# Patient Record
Sex: Female | Born: 1980 | Hispanic: Yes | Marital: Single | Smoking: Never smoker
Health system: Southern US, Community
[De-identification: ages and names within clinical notes are randomized; demographics above are authoritative.]

---

## 2021-04-24 ENCOUNTER — Other Ambulatory Visit: Payer: Self-pay

## 2021-04-24 DIAGNOSIS — Z1231 Encounter for screening mammogram for malignant neoplasm of breast: Secondary | ICD-10-CM

## 2021-05-01 ENCOUNTER — Ambulatory Visit: Payer: Self-pay | Attending: Oncology | Admitting: *Deleted

## 2021-05-01 ENCOUNTER — Ambulatory Visit: Payer: Self-pay

## 2021-05-01 ENCOUNTER — Encounter (INDEPENDENT_AMBULATORY_CARE_PROVIDER_SITE_OTHER): Payer: Self-pay

## 2021-05-01 ENCOUNTER — Other Ambulatory Visit: Payer: Self-pay

## 2021-05-01 VITALS — BP 107/71 | HR 68 | Resp 18 | Wt 170.0 lb

## 2021-05-01 DIAGNOSIS — Z1239 Encounter for other screening for malignant neoplasm of breast: Secondary | ICD-10-CM

## 2021-05-01 DIAGNOSIS — N6452 Nipple discharge: Secondary | ICD-10-CM

## 2021-05-01 NOTE — Progress Notes (Signed)
Ms. Kari Cook is a 41 y.o. female who presents to [facility] clinic today with complaint of bilateral spontaneous nipple discharge x one year that is clear.    Pap Smear: Pap smear not completed today. Last Pap smear was in January 2023 at [facility] clinic and was normal per patient. Per patient has history of an abnormal Pap smear 19 years ago that a colposcopy and LEEP were completed for follow up. Per patient all Pap smears have been normal since LEEP and that she has had at least three normals. Last Pap smear result is not available in Epic.   Physical exam: Breasts Breasts symmetrical. No skin abnormalities bilateral breasts. No nipple retraction bilateral breasts. Expressed a milky colored discharge from the right nipple on exam. Sample of discharge sent to Cytology for evaluation. Unable to express any nipple discharge from the left breast on exam. No lymphadenopathy. No lumps palpated bilateral breasts. No complaints of pain or tenderness on exam.       Pelvic/Bimanual Pap is not indicated today per BCCCP guidelines.   Smoking History: Patient has never smoked.    Patient Navigation: Patient education provided. Access to services provided for patient through [employer] program. Spanish interpreter Kristeen Mans from [facility] provided.    Breast and Cervical Cancer Risk Assessment: Patient does not have family history of breast cancer, known genetic mutations, or radiation treatment to the chest before age 61. Per patient has history of cervical dysplasia. Patient has no history of being immunocompromised or DES exposure in-utero.  Risk Assessment     Risk Scores       05/01/2021   Last edited by: Priscille Heidelberg, RN   5-year risk: 0.3 %   Lifetime risk: 4.6 %           A: BCCCP exam without pap smear Complaint of bilateral spontaneous nipple discharge.  P: Referred patient to the [facility] for a diagnostic mammogram. Sent  email to [facility] to call patient and schedule.  Priscille Heidelberg, RN 05/01/2021 3:20 PM

## 2021-05-01 NOTE — Patient Instructions (Signed)
Explained breast self awareness with Kari Cook. Patient did not need a Pap smear today due to last Pap smear was in January 2023 per patient. Let her know BCCCP will cover Pap smears every 3 years unless has a history of abnormal Pap smears. Referred patient to the [facility] for a diagnostic mammogram. Sent email to [facility] to call patient and schedule. Patient aware that she will receive a call to schedule mammogram appointment. Let patient know will follow up with her within the next couple weeks with results of her breast discharge by phone. Kari Cook verbalized understanding.  Derick Seminara, Kathaleen Maser, RN 3:20 PM

## 2021-05-06 LAB — CYTOLOGY - NON PAP

## 2021-05-08 ENCOUNTER — Telehealth: Payer: Self-pay

## 2021-05-08 NOTE — Telephone Encounter (Signed)
Called patient via Kari Cook, Kari Cook to give lab results for breast discharge. Informed patient that breast discharge showed no malignant cells. Reminded patient that she is scheduled for diagnostic mammo appointment @ [place] on 05/16/21 @ 9:40 am. Patient voiced understanding.

## 2021-05-16 ENCOUNTER — Ambulatory Visit
Admission: RE | Admit: 2021-05-16 | Discharge: 2021-05-16 | Disposition: A | Discharge status: Home / Self Care | Payer: Self-pay | Source: Ambulatory Visit | Attending: Obstetrics and Gynecology | Admitting: Obstetrics and Gynecology

## 2021-05-16 ENCOUNTER — Other Ambulatory Visit: Payer: Self-pay | Admitting: Obstetrics and Gynecology

## 2021-05-16 ENCOUNTER — Other Ambulatory Visit: Payer: Self-pay

## 2021-05-16 DIAGNOSIS — R928 Other abnormal and inconclusive findings on diagnostic imaging of breast: Secondary | ICD-10-CM

## 2021-05-16 DIAGNOSIS — N6452 Nipple discharge: Secondary | ICD-10-CM

## 2021-05-19 ENCOUNTER — Other Ambulatory Visit: Payer: Self-pay | Admitting: *Deleted

## 2021-05-19 DIAGNOSIS — R921 Mammographic calcification found on diagnostic imaging of breast: Secondary | ICD-10-CM

## 2021-05-20 ENCOUNTER — Other Ambulatory Visit: Payer: Self-pay | Admitting: Obstetrics and Gynecology

## 2021-05-20 DIAGNOSIS — R921 Mammographic calcification found on diagnostic imaging of breast: Secondary | ICD-10-CM

## 2021-05-31 ENCOUNTER — Other Ambulatory Visit: Payer: Self-pay

## 2021-05-31 ENCOUNTER — Ambulatory Visit
Admission: RE | Admit: 2021-05-31 | Discharge: 2021-05-31 | Disposition: A | Discharge status: Home / Self Care | Payer: Self-pay | Source: Ambulatory Visit | Attending: Obstetrics and Gynecology | Admitting: Obstetrics and Gynecology

## 2021-05-31 DIAGNOSIS — R921 Mammographic calcification found on diagnostic imaging of breast: Secondary | ICD-10-CM

## 2021-05-31 HISTORY — PX: BREAST BIOPSY: SHX20

## 2021-06-03 LAB — SURGICAL PATHOLOGY

## 2021-06-11 ENCOUNTER — Other Ambulatory Visit: Payer: Self-pay

## 2021-06-11 ENCOUNTER — Other Ambulatory Visit: Payer: Self-pay | Admitting: Obstetrics and Gynecology

## 2021-06-11 DIAGNOSIS — N6452 Nipple discharge: Secondary | ICD-10-CM

## 2021-06-12 ENCOUNTER — Ambulatory Visit: Admission: RE | Admit: 2021-06-12 | Payer: Self-pay | Source: Ambulatory Visit

## 2021-08-06 ENCOUNTER — Ambulatory Visit
Admission: RE | Admit: 2021-08-06 | Discharge: 2021-08-06 | Disposition: A | Discharge status: Home / Self Care | Payer: Self-pay | Source: Ambulatory Visit | Attending: Obstetrics and Gynecology | Admitting: Obstetrics and Gynecology

## 2021-08-06 DIAGNOSIS — N6452 Nipple discharge: Secondary | ICD-10-CM

## 2021-08-06 MED ORDER — GADOBUTROL 1 MMOL/ML IV SOLN
7.0000 mL | Freq: Once | INTRAVENOUS | Status: AC | PRN
  Administered 2021-08-06: 7 mL via INTRAVENOUS

## 2021-08-13 ENCOUNTER — Telehealth: Payer: Self-pay

## 2021-08-13 NOTE — Telephone Encounter (Signed)
Via Annice Pih, Spanish Interpreter [facility]), Patient informed MRI results, no malignancy seen in either breast, no reason seen for nipple discharge, recommendation is for surgical consult. Patient also informed that if further treatment is needed, BCCCP will offer BCCCP Medicaid, but must be a [place] citizen or a resident. If not, we can send application for [facility] Financial assistance. Patient stated she was not a citizen or resident, verbalized understanding. Joellyn Quails ([facility]-BCCCP Personal assistant) informed patient surgical appointment is Aug 16, 2021 @ 10:45 am with [facility].  ?

## 2021-08-19 ENCOUNTER — Ambulatory Visit (INDEPENDENT_AMBULATORY_CARE_PROVIDER_SITE_OTHER): Payer: Self-pay | Admitting: Surgery

## 2021-08-19 ENCOUNTER — Other Ambulatory Visit: Payer: Self-pay

## 2021-08-19 ENCOUNTER — Encounter: Payer: Self-pay | Admitting: Surgery

## 2021-08-19 VITALS — BP 118/77 | HR 80 | Temp 98.1°F | Ht 59.0 in | Wt 175.2 lb

## 2021-08-19 DIAGNOSIS — N6452 Nipple discharge: Secondary | ICD-10-CM

## 2021-08-19 NOTE — Patient Instructions (Addendum)
We will contact you November 2023 to schedule a follow up visit. If you do not hear from our office please call us.  ?

## 2021-08-20 NOTE — Progress Notes (Signed)
Patient ID: Kari Cook, female   DOB: 01/03/1981, 41 y.o.   MRN: [id number] ? ?HPI ?Kari Cook is a 41 y.o. female seen in consultation for right breast discharge.  She reports she has intermittent spontaneous nipple drainage from the right side mostly.  It is clear.  He denies any breast pain. ?He has had mammogram and ultrasound as well as an MRI.  I personally reviewed the images.  There was evidence of Dmitriy on the right breast retroareolar area.  This underwent biopsyBENIGN MAMMARY PARENCHYMA WITH ?FIBROCYSTIC, APOCRINE, AND COLUMNAR CELL CHANGES, WITH ASSOCIATED, there were some benign calcifications as well.Marland Kitchen ?MRI did not show any significant pathological lesions. ?He feels better. ? ?HPI ? ?History reviewed. No pertinent past medical history. ? ?Past Surgical History:  ?Procedure Laterality Date  ? BREAST BIOPSY Right 05/31/2021  ? stereo bx calcs, "X" clip-path pending  ? ? ?Family History  ?Problem Relation Age of Onset  ? Diabetes Mother   ? COPD Father   ? ? ?Social History ?Social History  ? ?Tobacco Use  ? Smoking status: Never  ? Smokeless tobacco: Never  ?Substance Use Topics  ? Alcohol use: Never  ? Drug use: Never  ? ? ?No Known Allergies ? ?Current Outpatient Medications  ?Medication Sig Dispense Refill  ? fluticasone (FLONASE) 50 MCG/ACT nasal spray Place 1 spray into both nostrils daily.    ? IBU 600 MG tablet Take 600 mg by mouth every 8 (eight) hours as needed.    ? ?No current facility-administered medications for this visit.  ? ? ? ?Review of Systems ?Full ROS  was asked and was negative except for the information on the HPI ? ?Physical Exam ?Blood pressure 118/77, pulse 80, temperature 98.1 ?F (36.7 ?C), temperature source Oral, height 4\' 11"  (1.499 m), weight 175 lb 3.2 oz (79.5 kg), SpO2 95 %. ?CONSTITUTIONAL: NAD. ?EYES: Pupils are equal, round, and reactive to light, Sclera are non-icteric. ?EARS, NOSE, MOUTH AND THROAT: The oropharynx is clear. The oral  mucosa is pink and moist. Hearing is intact to voice. ?LYMPH NODES:  Lymph nodes in the neck are normal. ?RESPIRATORY:  Lungs are clear. There is normal respiratory effort, with equal breath sounds bilaterally, and without pathologic use of accessory muscles. ?CARDIOVASCULAR: Heart is regular without murmurs, gallops, or rubs. ?BREAST: The nipples bilaterally are normal.  There are no palpable masses on either breast.  Bilateral axillas are free of disease.  No evidence of nipple discharge on either breast ?GI: The abdomen is  soft, nontender, and nondistended. There are no palpable masses. There is no hepatosplenomegaly. There are normal bowel sounds in all quadrants. ?GU: Rectal deferred.   ?MUSCULOSKELETAL: Normal muscle strength and tone. No cyanosis or edema.   ?SKIN: Turgor is good and there are no pathologic skin lesions or ulcers. ?NEUROLOGIC: Motor and sensation is grossly normal. Cranial nerves are grossly intact. ?PSYCH:  Oriented to person, place and time. Affect is normal. ? ?Data Reviewed ? ?I have personally reviewed the patient's imaging, laboratory findings and medical records.   ? ?Assessment/Plan ?41 year old female with history of nipple discharge right breast. ?At this time the symptoms and discharge seem to be sniffily improved.  Discussed with the patient in detail about her disease process at this time no need for surgical intervention.  I will follow her up in about 6 months.  If the discharge persists may need to perform ventral duct excision.  No evidence of malignancy.  Extensive counseling provided. ?  I spent 45 minutes in this encounter including personally reviewing imaging studies, counseling the patient, placing orders and performing appropriate documentation ? ? ?Sterling Big, MD FACS ?General Surgeon ?08/20/2021, 10:59 AM ? ?  ?

## 2022-02-19 ENCOUNTER — Ambulatory Visit: Payer: Self-pay | Admitting: Surgery

## 2022-03-17 ENCOUNTER — Encounter: Payer: Self-pay | Admitting: Surgery

## 2022-03-17 ENCOUNTER — Ambulatory Visit (INDEPENDENT_AMBULATORY_CARE_PROVIDER_SITE_OTHER): Payer: Self-pay | Admitting: Surgery

## 2022-03-17 VITALS — BP 108/72 | HR 74 | Temp 98.4°F | Wt 171.2 lb

## 2022-03-17 DIAGNOSIS — M533 Sacrococcygeal disorders, not elsewhere classified: Secondary | ICD-10-CM

## 2022-03-17 DIAGNOSIS — M25559 Pain in unspecified hip: Secondary | ICD-10-CM

## 2022-03-17 NOTE — Patient Instructions (Addendum)
Your CT is scheduled for 03/26/2022 at 11 am (arrive by 10:30 am) @ [facility]. Nothing to eat or drink 4 hours prior.     If you have any concerns or questions, please feel free to call our office.   Autoexamen de [employer] Breast Self-Awareness El autoexamen de mamas es para [employer] la apariencia y la sensibilidad de las [place]. Es necesario que respete estas indicaciones: Controle sus Counsellor. Informe al mdico si hay algn cambio. Familiarcese con el aspecto y con la forma en que se sienten sus mamas al palparlas. Esto puede ayudarle a Nurse, children's en las mamas mientras todava es pequeo y puede tratarse. Debe hacerse un autoexamen de mamas incluso si tiene implantes mamarios. Lo que necesita: Un espejo. Una habitacin bien iluminada. Una almohada u otro objeto blando. Cmo realizar el autoexamen de mamas Siga estos pasos para Education officer, environmental un autoexamen de mamas: Busque cambios  Qutese toda la ropa por encima de la cintura. Prese frente a un espejo en una habitacin con buena iluminacin. Coloque las manos a los costados. Compare las mamas en el espejo. Busque diferencias entre ellas, por ejemplo: Una diferencia en la forma. Una diferencia en el tamao. Arrugas, depresiones y protuberancias en [employer] y no en la otra.

## 2022-03-21 NOTE — Progress Notes (Addendum)
Outpatient Surgical Follow Up  03/21/2022  Kari Cook is an 41 y.o. female.   Chief Complaint  Patient presents with   Follow-up    6 month f/u bilateral breast discharge    HPI: 41 year old female seen in follow-up for right breast discharge.  She says that the discharge has subsided.  She denies any breast pain.  No breast masses.  She did have a mammogram as well as biopsy for calcifications showing evidence of benign pathology.  Furthermore she did complete an MRI of the bilateral breast showing no evidence of any additional pathology within the breast.  Please note that I have personally reviewed the images.  She now comes with coccyx pain.  She reports that the pain is near the rectal area.  No discharge no drainage no fevers no chills no hematochezia.  History reviewed. No pertinent past medical history.  Past Surgical History:  Procedure Laterality Date   BREAST BIOPSY Right 05/31/2021   stereo bx calcs, "X" clip-path pending    Family History  Problem Relation Age of Onset   Diabetes Mother    COPD Father     Social History:  reports that she has never smoked. She has never used smokeless tobacco. She reports that she does not drink alcohol and does not use drugs.  Allergies: No Known Allergies  Medications reviewed.    ROS Full ROS performed and is otherwise negative other than what is stated in HPI   BP 108/72   Pulse 74   Temp 98.4 F (36.9 C) (Oral)   Wt 171 lb 3.2 oz (77.7 kg)   SpO2 98%   BMI 34.58 kg/m   Physical Exam Vitals and nursing note reviewed. Exam conducted with a chaperone present.  Constitutional:      General: She is not in acute distress.    Appearance: Normal appearance. She is not ill-appearing.  Neck:     Vascular: No carotid bruit.  Cardiovascular:     Rate and Rhythm: Normal rate and regular rhythm.     Heart sounds: No murmur heard.    No friction rub.  Pulmonary:     Effort: Pulmonary effort is normal. No  respiratory distress.     Breath sounds: Normal breath sounds. No stridor.     Comments: BREAST; No evidence of palpable lesion on either breasts or axilla. No LAD, no nipple or skin changes, I was not able to witness any DC, nmo infection Abdominal:     General: Abdomen is flat. There is no distension.     Palpations: Abdomen is soft. There is no mass.     Tenderness: There is no abdominal tenderness. There is no guarding or rebound.     Hernia: No hernia is present.  Genitourinary:    Rectum: Normal.     Comments: NO obvious rectal lesion on rectal exam, no blood, pain in coccyx w/o abscess Musculoskeletal:        General: No swelling or tenderness. Normal range of motion.     Cervical back: Normal range of motion and neck supple. No rigidity or tenderness.  Lymphadenopathy:     Cervical: No cervical adenopathy.  Skin:    General: Skin is warm and dry.     Capillary Refill: Capillary refill takes less than 2 seconds.  Neurological:     General: No focal deficit present.     Mental Status: She is alert and oriented to person, place, and time.  Psychiatric:  Mood and Affect: Mood normal.        Behavior: Behavior normal.        Thought Content: Thought content normal.        Judgment: Judgment normal.    Assessment/Plan: 41 year old female with resolved right nipple discharge likely physiologic.  She did have mammographic evidence of calcifications that were nonpathological.  Now she comes for follow-up and on exam there are no concerning lesions on mammogram.  Another issue that she has is coccyx pain.  On physical exam I did not see evidence of an active infection or obvious anatomical lesions.  We will investigate this further with a CT of the pelvis to make sure were not missing any potential deeper pathology.  I had an extensive discussion with the patient about my thought process.  We will follow her up in a few weeks.  Please note that I spent 40 minutes in this encounter  including personally reviewing imaging studies, coordinating her care, placing orders and performing appropriate documentation.    Sterling Big, MD [facility] General Surgeon

## 2022-03-26 ENCOUNTER — Ambulatory Visit: Admission: RE | Admit: 2022-03-26 | Payer: Self-pay | Source: Ambulatory Visit

## 2023-04-09 ENCOUNTER — Other Ambulatory Visit: Payer: Self-pay

## 2023-04-09 DIAGNOSIS — R921 Mammographic calcification found on diagnostic imaging of breast: Secondary | ICD-10-CM

## 2023-04-09 DIAGNOSIS — N6452 Nipple discharge: Secondary | ICD-10-CM

## 2023-05-18 ENCOUNTER — Ambulatory Visit
Admission: RE | Admit: 2023-05-18 | Discharge: 2023-05-18 | Disposition: A | Discharge status: Home / Self Care | Payer: Self-pay | Source: Ambulatory Visit | Attending: Obstetrics and Gynecology | Admitting: Obstetrics and Gynecology

## 2023-05-18 ENCOUNTER — Ambulatory Visit: Payer: Self-pay | Attending: Hematology and Oncology | Admitting: *Deleted

## 2023-05-18 VITALS — BP 118/78 | Wt 180.1 lb

## 2023-05-18 DIAGNOSIS — N6452 Nipple discharge: Secondary | ICD-10-CM | POA: Insufficient documentation

## 2023-05-18 DIAGNOSIS — R921 Mammographic calcification found on diagnostic imaging of breast: Secondary | ICD-10-CM | POA: Insufficient documentation

## 2023-05-18 DIAGNOSIS — Z1239 Encounter for other screening for malignant neoplasm of breast: Secondary | ICD-10-CM

## 2023-05-18 NOTE — Patient Instructions (Signed)
 Explained breast self awareness with Kari Cook. Patient did not need a Pap smear today due to last Pap smear was 02/14/2021. Patient stated per her PCP she is due for a Pap smear this year. Referred patient to the [facility] for a diagnostic mammogram per recommendation. Appointment scheduled Monday, May 18, 2023 at 1340. Patient aware of appointment and will be there. [name] verbalized understanding.  Daneil Beem, Dela Favor, RN 12:57 PM

## 2023-05-18 NOTE — Progress Notes (Signed)
 Ms. Kari Cook is a 42 y.o. female who presents to [facility] clinic today with no complaints. Patient had a diagnostic mammogram completed 05/31/2021 that a right breast diagnostic mammogram is recommended in 6 months for follow up.   Pap Smear: Pap smear not completed today. Last Pap smear was 02/14/2021 at [facility] clinic and was normal with negative HPV. 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 Per patient has had at least three normal Pap smears since LEEP. Last Pap smear result is available in Epic.   Physical exam: Breasts Right breast larger than left breast that per patient is normal for her. No skin abnormalities bilateral breasts. No nipple retraction bilateral breasts. No nipple discharge bilateral breasts. No lymphadenopathy. No lumps palpated bilateral breasts. No complaints of pain or tenderness on exam.      MM RT BREAST BX W LOC DEV 1ST LESION IMAGE BX SPEC STEREO GUIDE Addendum Date: 06/04/2021 ADDENDUM REPORT: 06/04/2021 15:52 ADDENDUM: PATHOLOGY revealed: A. BREAST, RIGHT UPPER INNER QUADRANT; STEREOTACTIC CORE NEEDLE BIOPSY: - BENIGN MAMMARY PARENCHYMA WITH FIBROCYSTIC, APOCRINE, AND COLUMNAR CELL CHANGES, WITH ASSOCIATED CALCIFICATIONS. - NEGATIVE FOR ATYPICAL PROLIFERATIVE BREAST DISEASE. Pathology results are CONCORDANT with imaging findings, per Dr. Marvene Slipper. Pathology results and recommendations were discussed with patient via [facility] Interpreter [phone] by telephone on 06/03/2021. Patient reported biopsy site doing well with no adverse symptoms, and only slight tenderness at the site. Post biopsy care instructions were reviewed, questions were answered and my direct phone number was provided. Patient was instructed to call [facility] for any additional questions or concerns related to biopsy site.  RECOMMENDATIONS: 1. Recommend bilateral breast MRI for persistent bilateral nipple discharge. Recommend attention on MRI to the medial aspect of the right breast. If the breast MRI is performed and negative in the medial aspect of the right breast, recommend no further workup of this area. 2. If breast MRI is not performed, recommend diagnostic mammogram of the right breast in 6 months (August 2023). Pathology results reported by Ladonna Pickup RN on 06/03/2021. Electronically Signed   By: Allena Ito M.D.   On: 06/04/2021 15:52   Result Date: 06/04/2021 CLINICAL DATA:  43 year old with a mammographically detected indeterminate 7 mm group of calcifications in the UPPER INNER QUADRANT of the RIGHT breast at anterior depth. She presented with spontaneous clear nipple discharge bilaterally. EXAM: RIGHT BREAST STEREOTACTIC CORE NEEDLE BIOPSY COMPARISON:  Previous exams. FINDINGS: The patient and I discussed the procedure of stereotactic-guided biopsy including benefits and alternatives. We discussed the high likelihood of a successful procedure. We discussed the risks of the procedure including infection, bleeding, tissue injury, clip migration, and inadequate sampling. Communication with the patient was achieved with the assistance of a certified interpreter. Informed written consent was given. The usual time out protocol was performed immediately prior to the procedure. Lesion quadrant: UPPER INNER QUADRANT. Using sterile technique with chlorhexidine as skin antisepsis, 1% lidocaine and 1% lidocaine with epinephrine as local anesthetic, under stereotactic tomosynthesis guidance, a 9 gauge Brevera vacuum assisted device was used to perform core needle biopsy of calcifications in the upper inner RIGHT breast using a superior approach. Specimen radiograph was performed showing calcifications in at least 2 if not 3 of the core samples. Specimens with calcifications are identified for pathology. At the conclusion of the  procedure, an X  shaped tissue marker clip was deployed into the biopsy cavity. Follow-up 2D and 3D full field CC and mediolateral mammographic images were obtained to confirm clip placement. The X shaped tissue marker clip migrated approximately 1 cm inferior to the biopsied calcifications due to the accordion effect. There are a few residual calcifications at the biopsy site. Expected post biopsy changes are present without evidence of hematoma. IMPRESSION:.: IMPRESSION:. 1. Stereotactic tomosynthesis core needle biopsy of a group of calcifications in the UPPER INNER QUADRANT of the RIGHT breast at anterior depth. No apparent complications. 2. Approximate 1 cm inferior migration of the X shaped tissue marking clip relative to the biopsied calcifications. There are residual calcifications at the biopsy site. Electronically Signed: By: Rinda Cheers M.D. On: 05/31/2021 09:28  MS CLIP PLACEMENT RIGHT Addendum Date: 06/04/2021 ADDENDUM REPORT: 06/04/2021 15:52 ADDENDUM: PATHOLOGY revealed: A. BREAST, RIGHT UPPER INNER QUADRANT; STEREOTACTIC CORE NEEDLE BIOPSY: - BENIGN MAMMARY PARENCHYMA WITH FIBROCYSTIC, APOCRINE, AND COLUMNAR CELL CHANGES, WITH ASSOCIATED CALCIFICATIONS. - NEGATIVE FOR ATYPICAL PROLIFERATIVE BREAST DISEASE. Pathology results are CONCORDANT with imaging findings, per Dr. Marvene Slipper. Pathology results and recommendations were discussed with patient via [facility] Interpreter [phone] by telephone on 06/03/2021. Patient reported biopsy site doing well with no adverse symptoms, and only slight tenderness at the site. Post biopsy care instructions were reviewed, questions were answered and my direct phone number was provided. Patient was instructed to call [facility] for any additional questions or concerns related to biopsy site. RECOMMENDATIONS: 1. Recommend bilateral breast MRI for persistent bilateral nipple discharge. Recommend attention on MRI to the medial aspect of the right  breast. If the breast MRI is performed and negative in the medial aspect of the right breast, recommend no further workup of this area. 2. If breast MRI is not performed, recommend diagnostic mammogram of the right breast in 6 months (August 2023). Pathology results reported by Ladonna Pickup RN on 06/03/2021. Electronically Signed   By: Allena Ito M.D.   On: 06/04/2021 15:52   Result Date: 06/04/2021 CLINICAL DATA:  43 year old with a mammographically detected indeterminate 7 mm group of calcifications in the UPPER INNER QUADRANT of the RIGHT breast at anterior depth. She presented with spontaneous clear nipple discharge bilaterally. EXAM: RIGHT BREAST STEREOTACTIC CORE NEEDLE BIOPSY COMPARISON:  Previous exams. FINDINGS: The patient and I discussed the procedure of stereotactic-guided biopsy including benefits and alternatives. We discussed the high likelihood of a successful procedure. We discussed the risks of the procedure including infection, bleeding, tissue injury, clip migration, and inadequate sampling. Communication with the patient was achieved with the assistance of a certified interpreter. Informed written consent was given. The usual time out protocol was performed immediately prior to the procedure. Lesion quadrant: UPPER INNER QUADRANT. Using sterile technique with chlorhexidine as skin antisepsis, 1% lidocaine and 1% lidocaine with epinephrine as local anesthetic, under stereotactic tomosynthesis guidance, a 9 gauge Brevera vacuum assisted device was used to perform core needle biopsy of calcifications in the upper inner RIGHT breast using a superior approach. Specimen radiograph was performed showing calcifications in at least 2 if not 3 of the core samples. Specimens with calcifications are identified for pathology. At the conclusion of the procedure, an X shaped tissue marker clip was deployed into the biopsy cavity. Follow-up 2D and 3D full field CC and mediolateral mammographic images were  obtained to confirm clip placement. The X shaped tissue marker clip migrated approximately 1 cm inferior to the biopsied calcifications due to the accordion effect. There are  a few residual calcifications at the biopsy site. Expected post biopsy changes are present without evidence of hematoma. IMPRESSION:.: IMPRESSION:. 1. Stereotactic tomosynthesis core needle biopsy of a group of calcifications in the UPPER INNER QUADRANT of the RIGHT breast at anterior depth. No apparent complications. 2. Approximate 1 cm inferior migration of the X shaped tissue marking clip relative to the biopsied calcifications. There are residual calcifications at the biopsy site. Electronically Signed: By: Rinda Cheers M.D. On: 05/31/2021 09:28   MS DIGITAL DIAG TOMO BILAT Result Date: 05/16/2021 CLINICAL DATA:  43 year old female presenting with intermittent spontaneous clear nipple discharge bilaterally. Note from the provider reports brownish discharge. EXAM: DIGITAL DIAGNOSTIC BILATERAL MAMMOGRAM WITH TOMOSYNTHESIS AND CAD; ULTRASOUND LEFT BREAST LIMITED; ULTRASOUND RIGHT BREAST LIMITED TECHNIQUE: Bilateral digital diagnostic mammography and breast tomosynthesis was performed. The images were evaluated with computer-aided detection.; Targeted ultrasound examination of the left breast was performed.; Targeted ultrasound examination of the right breast was performed COMPARISON:  None. ACR Breast Density Category b: There are scattered areas of fibroglandular density. FINDINGS: Mammogram: Right breast: Spot 2D magnification views as well as spot compression tomosynthesis views were performed in addition to standard views. There are grouped punctate calcifications in the retroareolar superior right breast best seen on lateral view spanning approximately 0.7 cm. There is a regional asymmetry in the medial aspect of the right breast which is less conspicuous on the spot imaging and may represent normal tissue. An additional questioned  asymmetry in the outer aspect of the right breast resolves on the additional imaging. Left breast: Spot 2D magnification views of the retroareolar left breast were performed in addition to standard views. There is no abnormality in the retroareolar aspect or elsewhere in the left breast just the presence of malignancy. On physical exam there are no skin changes or rash. The patient is unable to express any nipple discharge from the right nipple. Ultrasound: Right breast: Targeted ultrasound performed throughout the retroareolar aspect of the right breast demonstrating a small oval circumscribed anechoic mass at 12 o'clock retroareolar measuring 0.6 x 0.3 x 0.3 cm, possibly a focally ectatic duct or cyst. Targeted ultrasound performed throughout the medial aspect of the right breast demonstrates no cystic or solid mass. Left breast: Targeted ultrasound performed throughout the retroareolar aspect of the left breast demonstrates no cystic or solid mass. No dilated duct or fluid collection. IMPRESSION: 1. Indeterminate calcifications spanning 0.7 cm in the superior retroareolar right breast. 2. Probably benign asymmetry in the medial aspect of the right breast without sonographic correlate. 3. No mammographic or sonographic evidence of malignancy in the left breast. RECOMMENDATION: 1. Stereotactic core needle biopsy of the right breast calcifications. 2. Following biopsy recommend breast MRI for the bilateral nipple discharge. Recommend attention on MRI to the medial aspect of the right breast. 3. If the breast MRI is performed and negative in the medial aspect of the right breast recommend no further workup of this area. If the MRI is not performed recommend diagnostic mammogram of the right breast in 6 months. I have discussed the findings and recommendations with the patient. She will be contacted by our scheduler to arrange the biopsy appointment. BI-RADS CATEGORY  4: Suspicious. Electronically Signed   By: Allena Ito M.D.   On: 05/16/2021 11:44   Pelvic/Bimanual Pap is not indicated today per BCCCP guidelines.   Smoking History: Patient has never smoked.   Patient Navigation: Patient education provided. Access to services provided for patient through [facility] program. Spanish interpreter [place]  Kari Cook from [facility] provided.    Breast and Cervical Cancer Risk Assessment: Patient does not have family history of breast cancer, known genetic mutations, or radiation treatment to the chest before age 70. Per patient has history of cervical dysplasia. Patient has no history of being immunocompromised or DES exposure in-utero.  Risk Scores as of Encounter on 05/18/2023     Kari Cook           5-year 1.08%   Lifetime 10.52%   This patient is [place] but has no documented birth country, so the [place] model used data from [place] patients to calculate their risk score. Document a birth country in the Demographics activity for a more accurate score.         Last calculated by Kari Ingle, LPN on 09/09/5407 at 12:51 PM        A: BCCCP exam without pap smear No complaints.  P: Referred patient to the [facility] for a diagnostic mammogram per recommendation. Appointment scheduled Monday, May 18, 2023 at 1340.  Kari Edge, RN 05/18/2023 12:57 PM

## 2023-05-25 ENCOUNTER — Ambulatory Visit: Payer: Self-pay | Admitting: Surgery

## 2023-12-08 IMAGING — US US BREAST*L* LIMITED INC AXILLA
1 series · 4 of 4 positions shown · non-contrast
Comparison: None.

CLINICAL DATA: 40-year-old female presenting with intermittent
spontaneous clear nipple discharge bilaterally. Note from the
provider reports brownish discharge.



[Series 1: us breast*left* limited inc axilla · 0.07mm/px · 4 of 4 slices shown]
[im 1/4]
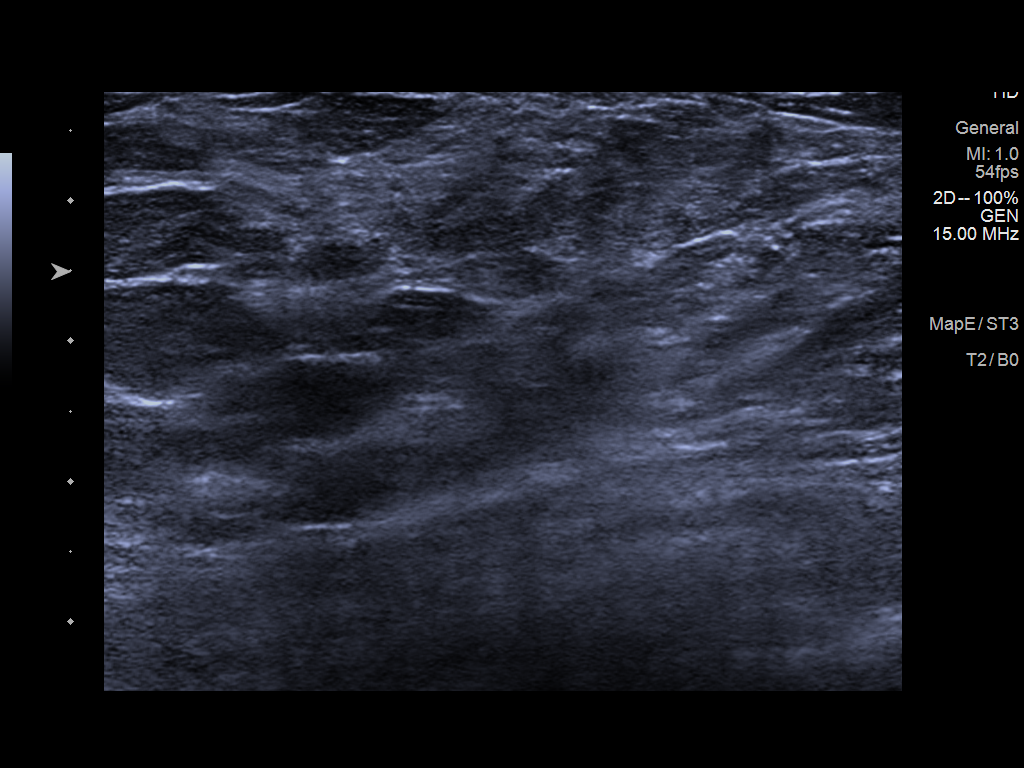
[im 2/4]
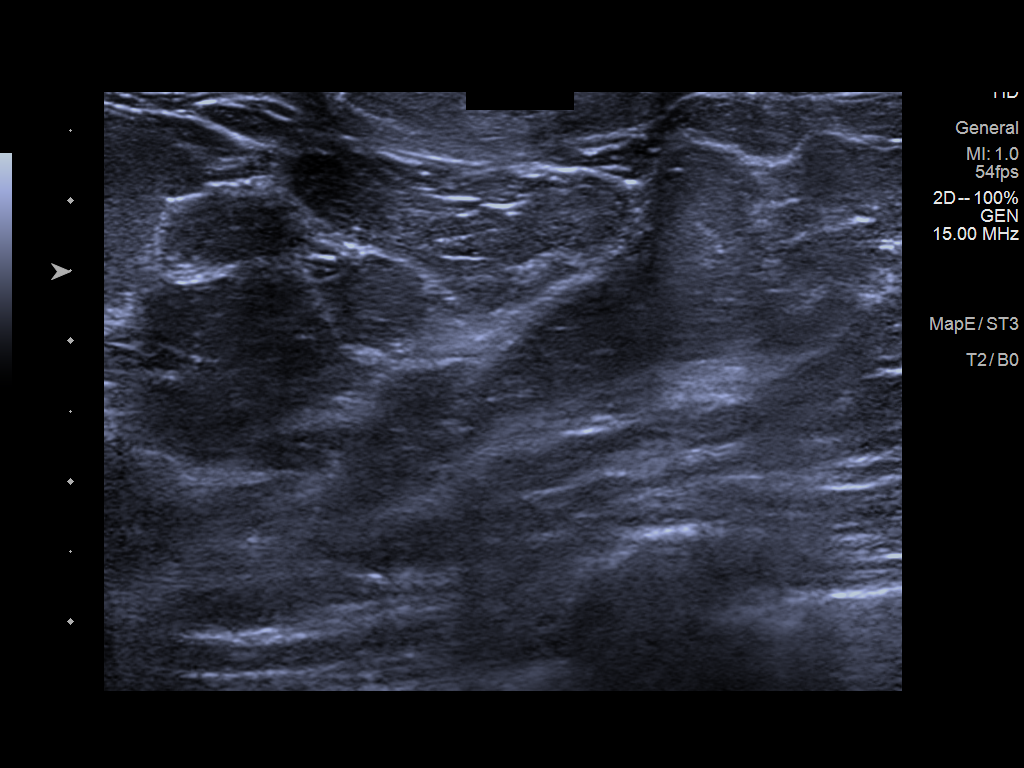
[im 3/4]
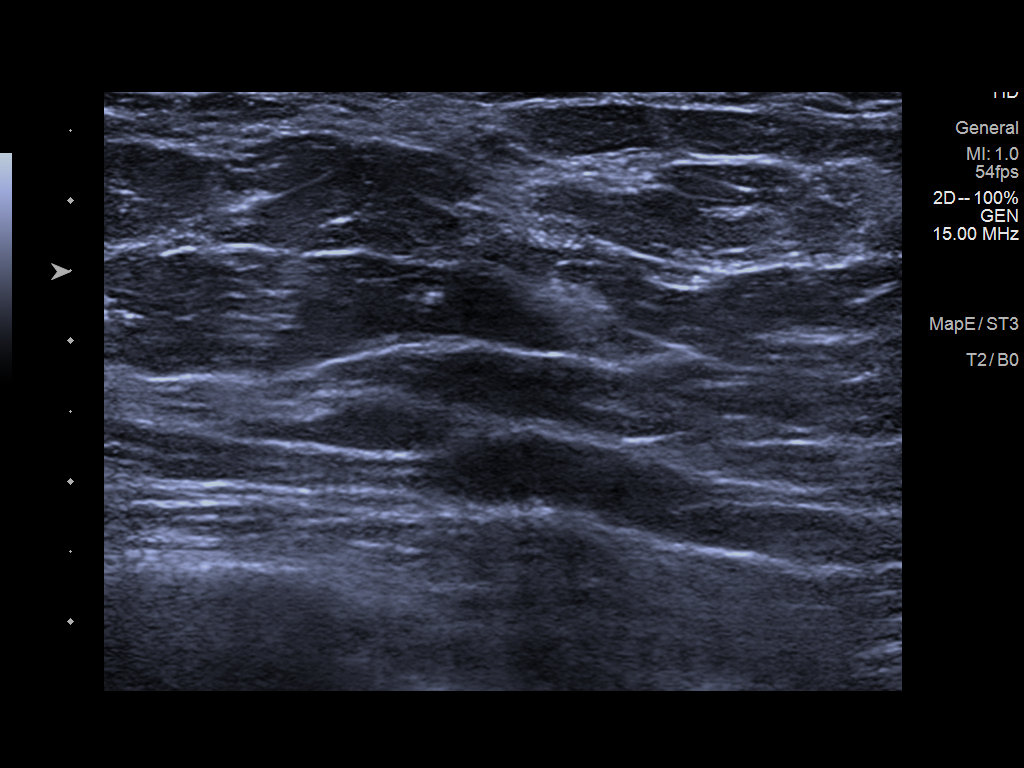
[im 4/4]
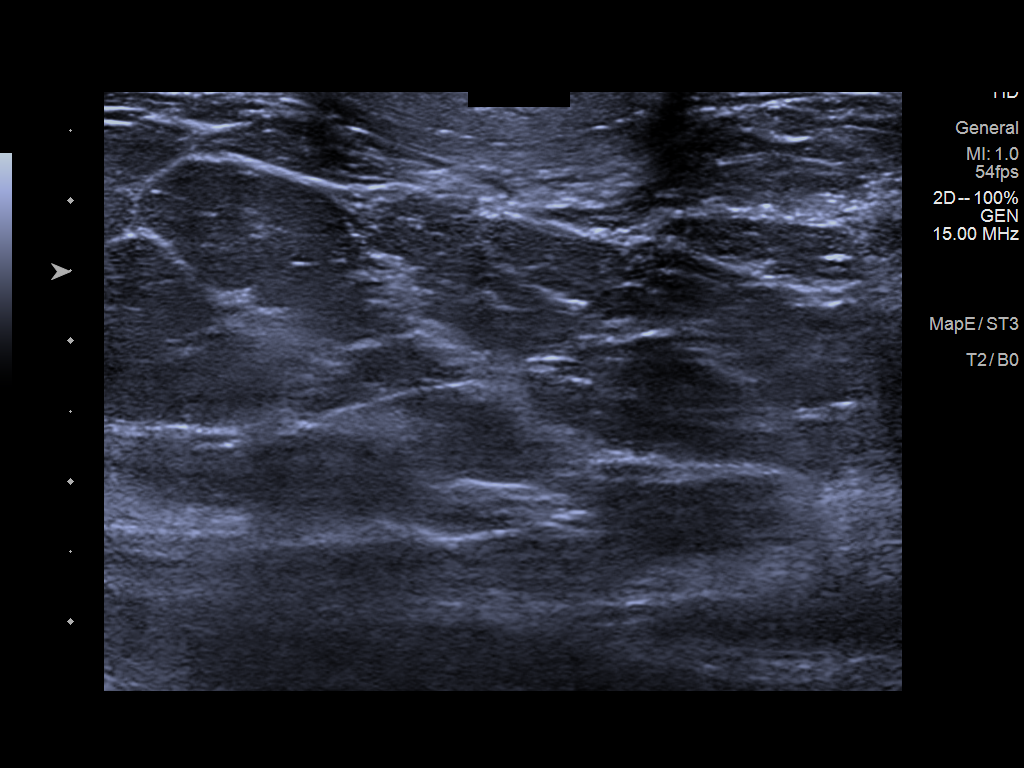

[4 of 4 positions shown; findings below may reference images not displayed]

ACR Breast Density Category b: There are scattered areas of
fibroglandular density.
FINDINGS: Mammogram:

Right breast: Spot 2D magnification views as well as spot
compression tomosynthesis views were performed in addition to
standard views. There are grouped punctate calcifications in the
retroareolar superior right breast best seen on lateral view
spanning approximately 0.7 cm. There is a regional asymmetry in the
medial aspect of the right breast which is less conspicuous on the
spot imaging and may represent normal tissue. An additional
questioned asymmetry in the outer aspect of the right breast
resolves on the additional imaging.

Left breast: Spot 2D magnification views of the retroareolar left
breast were performed in addition to standard views. There is no
abnormality in the retroareolar aspect or elsewhere in the left
breast just the presence of malignancy.

On physical exam there are no skin changes or rash. The patient is
unable to express any nipple discharge from the right nipple.

Ultrasound:

Right breast: Targeted ultrasound performed throughout the
retroareolar aspect of the right breast demonstrating a small oval
circumscribed anechoic mass at 12 o'clock retroareolar measuring
x 0.3 x 0.3 cm, possibly a focally ectatic duct or cyst. Targeted
ultrasound performed throughout the medial aspect of the right
breast demonstrates no cystic or solid mass.

Left breast: Targeted ultrasound performed throughout the
retroareolar aspect of the left breast demonstrates no cystic or
solid mass. No dilated duct or fluid collection.
IMPRESSION: 1. Indeterminate calcifications spanning 0.7 cm in the superior
retroareolar right breast.

2. Probably benign asymmetry in the medial aspect of the right
breast without sonographic correlate.

3. No mammographic or sonographic evidence of malignancy in the left
breast.

RECOMMENDATION:
1. Stereotactic core needle biopsy of the right breast
calcifications.

2. Following biopsy recommend breast MRI for the bilateral nipple
discharge. Recommend attention on MRI to the medial aspect of the
right breast.

3. If the breast MRI is performed and negative in the medial aspect
of the right breast recommend no further workup of this area. If the
MRI is not performed recommend diagnostic mammogram of the right
breast in 6 months.

I have discussed the findings and recommendations with the patient.
She will be contacted by our scheduler to arrange the biopsy
appointment.

BI-RADS CATEGORY  4: Suspicious.

## 2023-12-08 IMAGING — US US BREAST*R* LIMITED INC AXILLA
2 series · 11 of 11 positions shown · non-contrast
Comparison: None.

CLINICAL DATA: 40-year-old female presenting with intermittent
spontaneous clear nipple discharge bilaterally. Note from the
provider reports brownish discharge.



[Series 1: us breast*right* limited inc axilla · 0.07mm/px · 9 of 9 slices shown (1 of 2)]
[im 1/9]
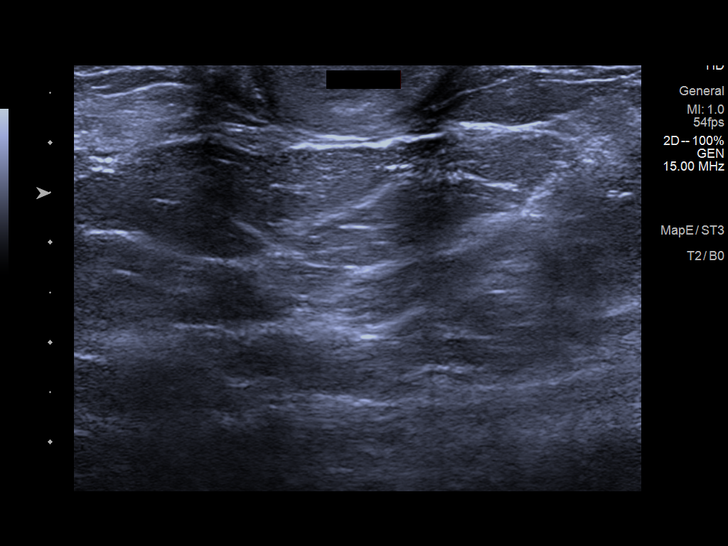
[im 2/9]
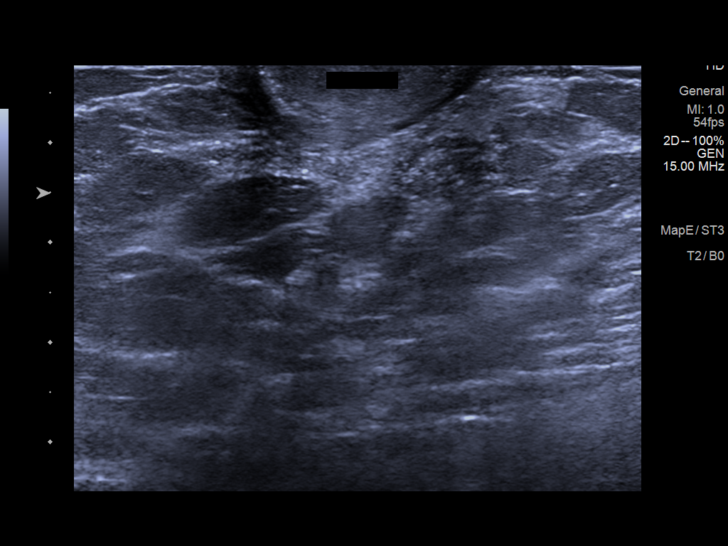
[im 3/9]
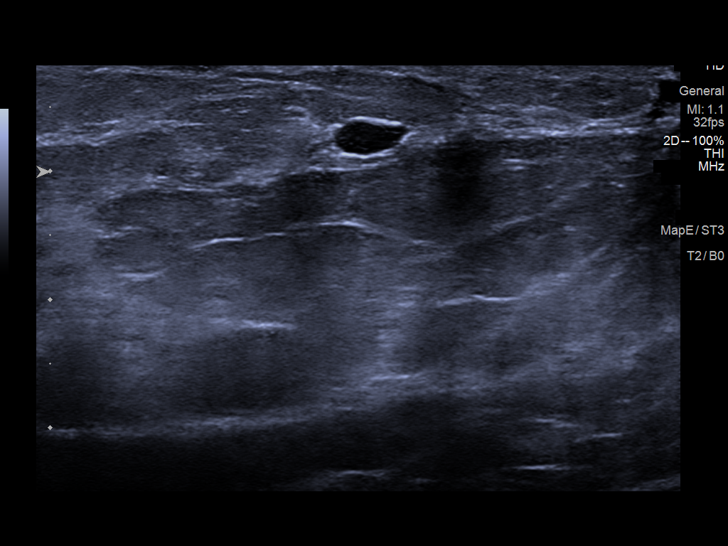
[im 4/9]
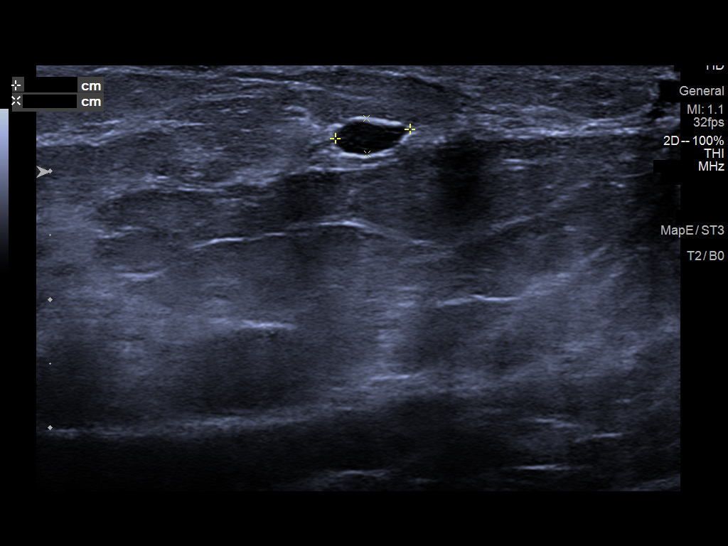
[im 5/9]
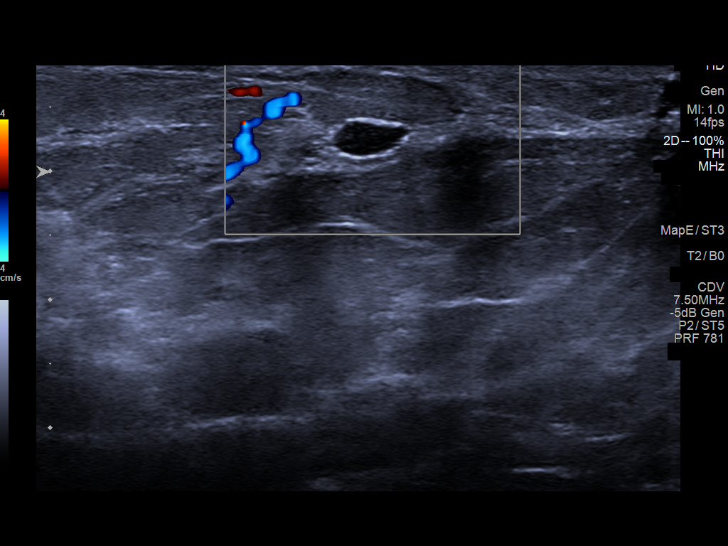
[im 6/9]
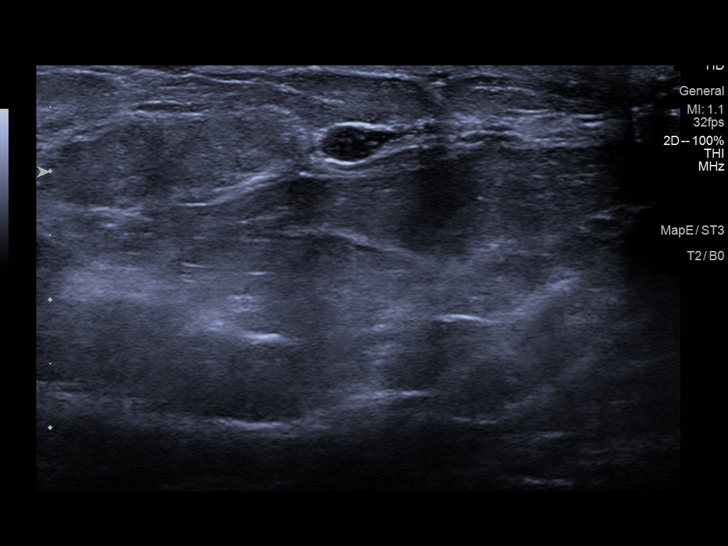
[im 7/9]
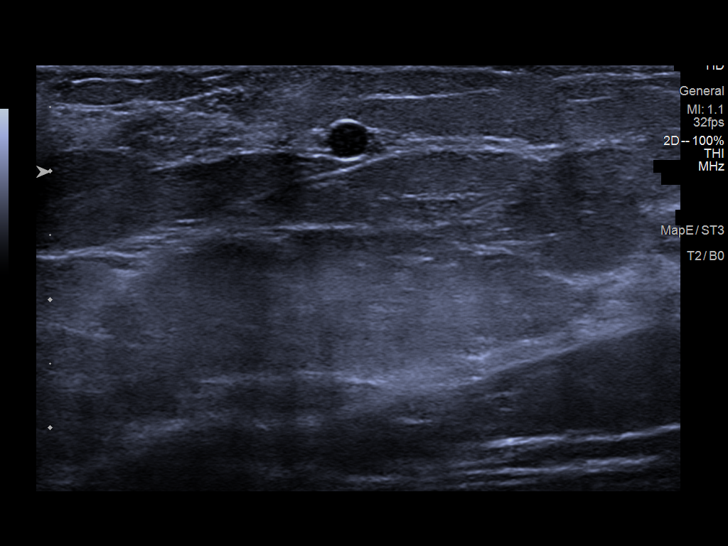
[im 8/9]
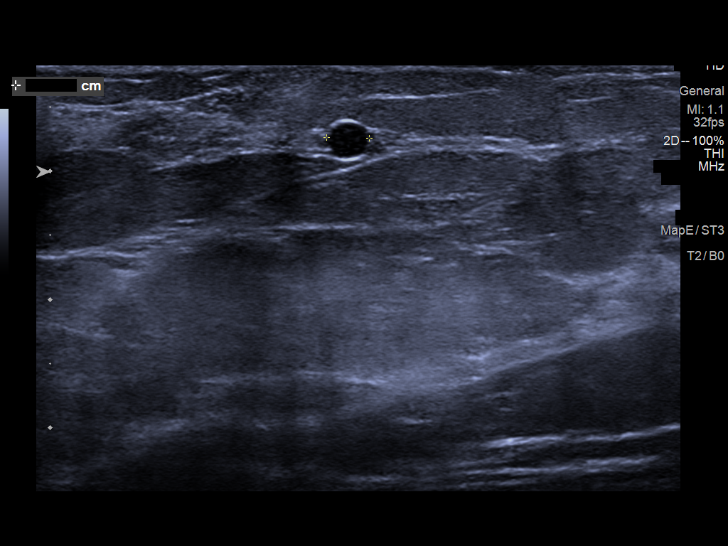
[im 9/9]
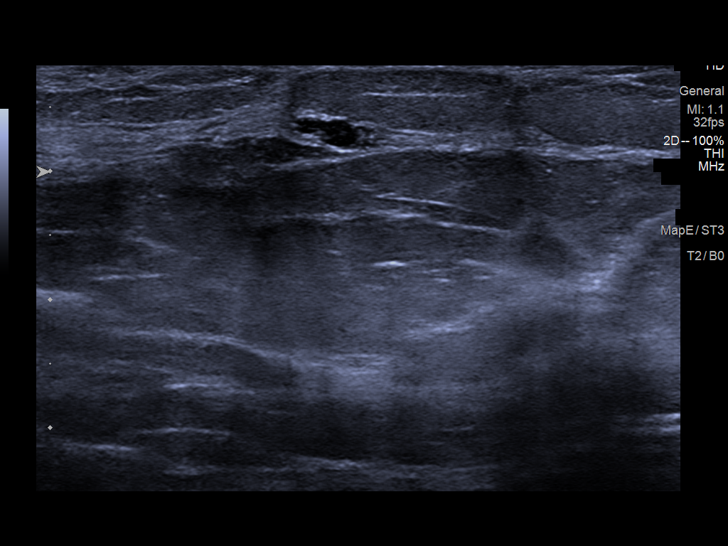

[Series 2: us breast*right* limited inc axilla · 0.06mm/px · 2 of 2 slices shown (2 of 2)]
[im 1/2]
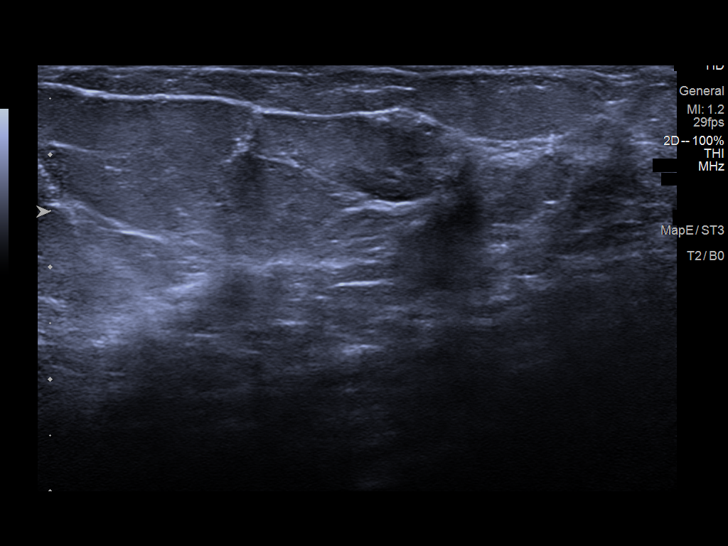
[im 2/2]
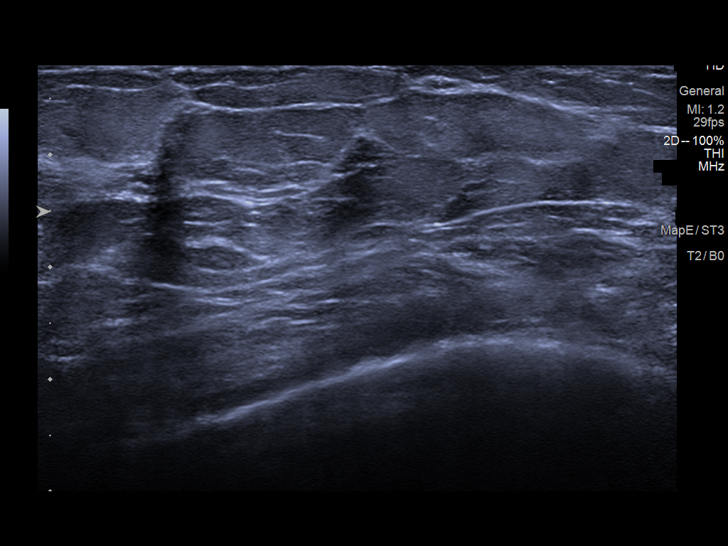

[11 of 11 positions shown; findings below may reference images not displayed]

ACR Breast Density Category b: There are scattered areas of
fibroglandular density.
FINDINGS: Mammogram:

Right breast: Spot 2D magnification views as well as spot
compression tomosynthesis views were performed in addition to
standard views. There are grouped punctate calcifications in the
retroareolar superior right breast best seen on lateral view
spanning approximately 0.7 cm. There is a regional asymmetry in the
medial aspect of the right breast which is less conspicuous on the
spot imaging and may represent normal tissue. An additional
questioned asymmetry in the outer aspect of the right breast
resolves on the additional imaging.

Left breast: Spot 2D magnification views of the retroareolar left
breast were performed in addition to standard views. There is no
abnormality in the retroareolar aspect or elsewhere in the left
breast just the presence of malignancy.

On physical exam there are no skin changes or rash. The patient is
unable to express any nipple discharge from the right nipple.

Ultrasound:

Right breast: Targeted ultrasound performed throughout the
retroareolar aspect of the right breast demonstrating a small oval
circumscribed anechoic mass at 12 o'clock retroareolar measuring
x 0.3 x 0.3 cm, possibly a focally ectatic duct or cyst. Targeted
ultrasound performed throughout the medial aspect of the right
breast demonstrates no cystic or solid mass.

Left breast: Targeted ultrasound performed throughout the
retroareolar aspect of the left breast demonstrates no cystic or
solid mass. No dilated duct or fluid collection.
IMPRESSION: 1. Indeterminate calcifications spanning 0.7 cm in the superior
retroareolar right breast.

2. Probably benign asymmetry in the medial aspect of the right
breast without sonographic correlate.

3. No mammographic or sonographic evidence of malignancy in the left
breast.

RECOMMENDATION:
1. Stereotactic core needle biopsy of the right breast
calcifications.

2. Following biopsy recommend breast MRI for the bilateral nipple
discharge. Recommend attention on MRI to the medial aspect of the
right breast.

3. If the breast MRI is performed and negative in the medial aspect
of the right breast recommend no further workup of this area. If the
MRI is not performed recommend diagnostic mammogram of the right
breast in 6 months.

I have discussed the findings and recommendations with the patient.
She will be contacted by our scheduler to arrange the biopsy
appointment.

BI-RADS CATEGORY  4: Suspicious.

## 2024-02-28 IMAGING — MR MR BREAST BILAT WO/W CM
2 of 12 series · 6 of 48 positions shown · IV contrast (7ml Gadavist)
Comparison: None Available.

CLINICAL DATA: 40-year-old female presenting for diagnostic
evaluation of bilateral intermittent spontaneous clear nipple
discharge. Patient had a recent right breast biopsy demonstrating
benign calcifications.

EXAM:
BILATERAL BREAST MRI WITH AND WITHOUT CONTRAST
TECHNIQUE: Multiplanar, multisequence MR images of both breasts were obtained
prior to and following the intravenous administration of 7 ml of
Gadavist

[Series 2: T1 · axial · B · 1.5mm · 1.02mm/px · z∈[-83,+83]mm · 5 of 112 slices shown]
[im 1/112]
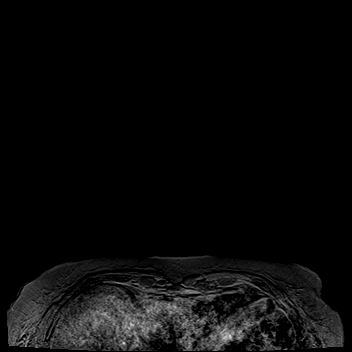
[im 28/112]
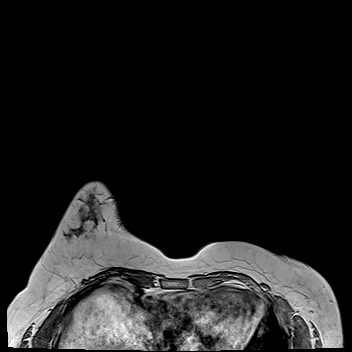
[im 56/112]
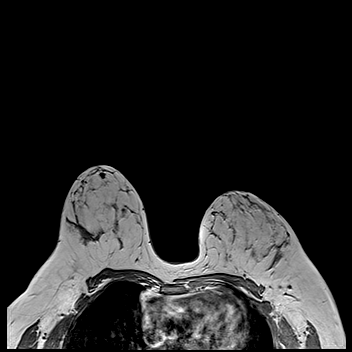
[im 84/112]
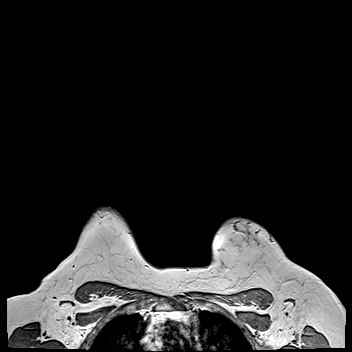
[im 112/112]
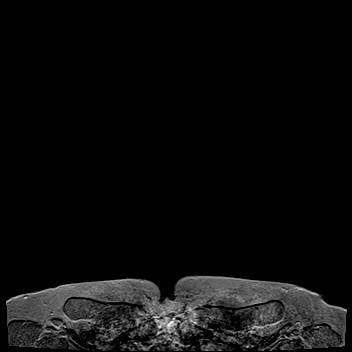

[Series 3: T2 · axial · B · 3.0mm · 1.02mm/px · 1 of 45 slices shown]
[im 1/45]
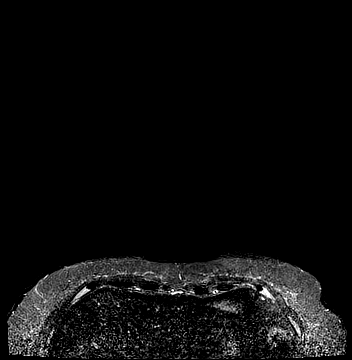

[6 of 48 positions shown; findings below may reference images not displayed]

Three-dimensional MR images were rendered by post-processing of the
original MR data on an independent workstation. The
three-dimensional MR images were interpreted, and findings are
reported in the following complete MRI report for this study. Three
dimensional images were evaluated at the independent interpreting
workstation using the DynaCAD thin client.
FINDINGS: Breast composition: b. Scattered fibroglandular tissue.

Background parenchymal enhancement: Moderate.

Right breast: No mass or abnormal enhancement. Specifically there is
no abnormality in the medial aspect of the breast.

Left breast: No mass or abnormal enhancement.

Lymph nodes: No abnormal appearing lymph nodes.

Ancillary findings:  None.
IMPRESSION: No MRI evidence of malignancy in either breast.

RECOMMENDATION:
Surgical consultation for nipple discharge.

BI-RADS CATEGORY  1: Negative.
